# Patient Record
Sex: Male | Born: 2005 | Race: Black or African American | Hispanic: No | Marital: Single | Smoking: Never smoker
Health system: Southern US, Community
[De-identification: ages and names within clinical notes are randomized; demographics above are authoritative.]

---
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
## 2024-02-10 ENCOUNTER — Emergency Department (HOSPITAL_COMMUNITY)
Admission: EM | Admit: 2024-02-10 | Discharge: 2024-02-11 | Disposition: A | Discharge status: Home / Self Care | Attending: Emergency Medicine | Admitting: Emergency Medicine

## 2024-02-10 ENCOUNTER — Other Ambulatory Visit: Payer: Self-pay

## 2024-02-10 ENCOUNTER — Encounter (HOSPITAL_COMMUNITY): Payer: Self-pay | Admitting: Emergency Medicine

## 2024-02-10 DIAGNOSIS — A888 Other specified viral infections of central nervous system: Secondary | ICD-10-CM | POA: Diagnosis not present

## 2024-02-10 DIAGNOSIS — B09 Unspecified viral infection characterized by skin and mucous membrane lesions: Secondary | ICD-10-CM

## 2024-02-10 DIAGNOSIS — R509 Fever, unspecified: Secondary | ICD-10-CM | POA: Insufficient documentation

## 2024-02-10 LAB — RESP PANEL BY RT-PCR (RSV, FLU A&B, COVID)  RVPGX2
Influenza A by PCR: NEGATIVE
Influenza B by PCR: NEGATIVE
Resp Syncytial Virus by PCR: NEGATIVE
SARS Coronavirus 2 by RT PCR: NEGATIVE

## 2024-02-10 MED ORDER — ACETAMINOPHEN 325 MG PO TABS
650.0000 mg | ORAL_TABLET | Freq: Once | ORAL | Status: AC | PRN
  Administered 2024-02-10: 650 mg via ORAL
  Filled 2024-02-10: qty 2

## 2024-02-10 NOTE — ED Triage Notes (Signed)
 Patient reports generalized body aches, headache, and cough since Saturday. Denies sick contacts.

## 2024-02-11 MED ORDER — ACETAMINOPHEN 325 MG PO TABS: 650.0000 mg | ORAL_TABLET | Freq: Once | ORAL | Status: DC | PRN

## 2024-02-11 MED ORDER — IBUPROFEN 800 MG PO TABS
800.0000 mg | ORAL_TABLET | Freq: Once | ORAL | Status: AC
  Administered 2024-02-11: 800 mg via ORAL
  Filled 2024-02-11: qty 1

## 2024-02-11 NOTE — Discharge Instructions (Addendum)
 Your symptoms are consistent with a viral illness. Take tylenol for fever and ibuprofen or Aleve for headaches, body aches. Drink plenty of fluids to prevent dehydration. You may continue to use other over-the-counter remedies for symptom control, if desired. Return for new or concerning symptoms such as worsening shortness of breath, coughing up blood, persistent vomiting, loss of consciousness.

## 2024-02-11 NOTE — ED Provider Notes (Signed)
 [facility] Provider Note   CSN: [id number] Arrival date & time: 02/10/24  2024     History  Chief Complaint  Patient presents with   Generalized Body Aches    Curtis Bush is a 18 y.o. male.  18 year old male presents to the emergency department for evaluation of body aches.  He has also had a cough which has been dry, nonproductive.  Has a discomfort in his occipital scalp with persistent coughing.  Has tried Tylenol and NSAIDs for symptoms without relief.  Over the past 3 days has also noticed a red rash on his arms and legs.  Noted to be febrile in triage today.  No known sick contacts.  Denies vomiting, diarrhea, sore throat, inability to swallow/drooling, SOB.    The history is provided by the patient. No language interpreter was used.       Home Medications Prior to Admission medications   Medication Sig Start Date End Date Taking? Authorizing Provider  albuterol (VENTOLIN HFA) 108 (90 Base) MCG/ACT inhaler Inhale 1-2 puffs into the lungs every 6 (six) hours as needed for wheezing or shortness of breath. 10/11/21   Coralyn Mark, NP  azithromycin (ZITHROMAX) 250 MG tablet Take 1 tablet (250 mg total) by mouth daily. Take first 2 tablets together, then 1 every day until finished. 10/30/21   Mardella Layman, MD  benzonatate (TESSALON) 200 MG capsule Take 1 capsule (200 mg total) by mouth 3 (three) times daily as needed for cough. 01/05/21   Domenick Gong, MD  fluticasone (FLONASE) 50 MCG/ACT nasal spray Place 2 sprays into both nostrils daily. 01/05/21   Domenick Gong, MD  predniSONE (DELTASONE) 20 MG tablet Take 2 tablets (40 mg total) by mouth daily. 10/30/21   Mardella Layman, MD  promethazine-dextromethorphan (PROMETHAZINE-DM) 6.25-15 MG/5ML syrup Take 5 mLs by mouth 4 (four) times daily as needed for cough. 10/30/21   Mardella Layman, MD  sodium chloride (OCEAN) 0.65 % SOLN nasal spray Place 2 sprays into the nose as needed  for congestion. 12/06/17 08/23/20  Ronnell Freshwater, NP      Allergies    Patient has no known allergies.    Review of Systems   Review of Systems Ten systems reviewed and are negative for acute change, except as noted in the HPI.    Physical Exam Updated Vital Signs BP 135/61 (BP Location: Right Arm)   Pulse 100   Temp 99.9 F (37.7 C) (Oral)   Resp 20   Ht 6\' 1"  (1.854 m)   Wt 127 kg   SpO2 98%   BMI 36.94 kg/m   Physical Exam Vitals and nursing note reviewed.  Constitutional:      General: He is not in acute distress.    Appearance: He is well-developed. He is not diaphoretic.     Comments: Nontoxic appearing and in NAD  HENT:     Head: Normocephalic and atraumatic.     Mouth/Throat:     Mouth: Mucous membranes are moist.     Comments: No oral lesions or exudates. Tolerating secretions, normal phonation Eyes:     General: No scleral icterus.    Conjunctiva/sclera: Conjunctivae normal.  Neck:     Comments: No meningismus Cardiovascular:     Rate and Rhythm: Normal rate and regular rhythm.     Pulses: Normal pulses.  Pulmonary:     Effort: Pulmonary effort is normal. No respiratory distress.     Breath sounds: No stridor. No wheezing.  Comments: Respirations even and unlabored Musculoskeletal:        General: Normal range of motion.     Cervical back: Normal range of motion.  Skin:    General: Skin is warm and dry.     Coloration: Skin is not pale.     Findings: Rash present.     Comments: Scattered erythematous, nodular rash to extremities. No petechiae.   Neurological:     Mental Status: He is alert and oriented to person, place, and time.     Coordination: Coordination normal.  Psychiatric:        Behavior: Behavior normal.     ED Results / Procedures / Treatments   Labs (all labs ordered are listed, but only abnormal results are displayed) Labs Reviewed  RESP PANEL BY RT-PCR (RSV, FLU A&B, COVID)  [id number]     EKG None  Radiology No results found.  Procedures Procedures    Medications Ordered in ED Medications  acetaminophen (TYLENOL) tablet 650 mg (650 mg Oral Given 02/10/24 2044)  ibuprofen (ADVIL) tablet 800 mg (800 mg Oral Given 02/11/24 0352)    ED Course/ Medical Decision Making/ A&P                                 Medical Decision Making Risk OTC drugs. Prescription drug management.   This patient presents to the ED for concern of fever, body aches, this involves an extensive number of treatment options, and is a complaint that carries with it a high risk of complications and morbidity.  The differential diagnosis includes COVID vs influenza vs other viral illness vs PNA vs rhabdomyolysis.   Co morbidities that complicate the patient evaluation  None known   Lab Tests:  I Ordered, and personally interpreted labs.  The pertinent results include: Respiratory viral panel negative for influenza and COVID as well as RSV   Cardiac Monitoring:  The patient was maintained on a cardiac monitor.  I personally viewed and interpreted the cardiac monitored which showed an underlying rhythm of: NSR   Medicines ordered and prescription drug management:  I ordered medication including Tylenol and ibuprofen for fever, myalgias  Reevaluation of the patient after these medicines showed that the patient improved I have reviewed the patients home medicines and have made adjustments as needed   Test Considered:  CBC   Problem List / ED Course:  Symptoms suspected secondary to a viral illness.  His COVID and flu tests are negative in the emergency department.  However, fever does respond appropriately to antipyretics.  Clinically, the patient is well-appearing and nontoxic.  He has no nuchal rigidity or meningismus to suggest meningitis.  Rash noted to extremities, primarily.  This appears most consistent with erythema nodosum.  Counseled on supportive management as well as  primary care follow-up.   Reevaluation:  After the interventions noted above, I reevaluated the patient and found that they have : remained stable   Dispostion:  After consideration of the diagnostic results and the patients response to treatment, I feel that the patent would benefit from supportive care measures and PCP f/u. Return precautions discussed and provided. Patient discharged in stable condition with no unaddressed concerns.         Final Clinical Impression(s) / ED Diagnoses Final diagnoses:  Febrile illness  Viral exanthem    Rx / DC Orders ED Discharge Orders     None  Antony Madura, PA-C 02/11/24 0413    Sabas Sous, MD 02/11/24 [phone]
